# Patient Record
Sex: Female | Born: 2000 | Race: Black or African American | Hispanic: No | Marital: Single | Smoking: Never smoker
Health system: Southern US, Community
[De-identification: ages and names within clinical notes are randomized; demographics above are authoritative.]

## PROBLEM LIST (undated history)

## (undated) DIAGNOSIS — J210 Acute bronchiolitis due to respiratory syncytial virus: Secondary | ICD-10-CM

## (undated) DIAGNOSIS — J45909 Unspecified asthma, uncomplicated: Secondary | ICD-10-CM

## (undated) HISTORY — PX: CYSTECTOMY: SUR359

## (undated) HISTORY — PX: OTHER SURGICAL HISTORY: SHX169

---
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
## 2021-02-01 ENCOUNTER — Ambulatory Visit: Payer: Self-pay

## 2021-02-16 ENCOUNTER — Emergency Department
Admission: EM | Admit: 2021-02-16 | Discharge: 2021-02-16 | Disposition: A | Discharge status: Home / Self Care | Payer: Medicaid Other | Attending: Emergency Medicine | Admitting: Emergency Medicine

## 2021-02-16 ENCOUNTER — Emergency Department: Payer: Medicaid Other

## 2021-02-16 ENCOUNTER — Other Ambulatory Visit: Payer: Self-pay

## 2021-02-16 ENCOUNTER — Encounter: Payer: Self-pay | Admitting: Emergency Medicine

## 2021-02-16 DIAGNOSIS — Z7951 Long term (current) use of inhaled steroids: Secondary | ICD-10-CM | POA: Insufficient documentation

## 2021-02-16 DIAGNOSIS — M79641 Pain in right hand: Secondary | ICD-10-CM

## 2021-02-16 DIAGNOSIS — S6991XA Unspecified injury of right wrist, hand and finger(s), initial encounter: Secondary | ICD-10-CM | POA: Diagnosis present

## 2021-02-16 DIAGNOSIS — S62514A Nondisplaced fracture of proximal phalanx of right thumb, initial encounter for closed fracture: Secondary | ICD-10-CM | POA: Insufficient documentation

## 2021-02-16 DIAGNOSIS — J45909 Unspecified asthma, uncomplicated: Secondary | ICD-10-CM | POA: Diagnosis not present

## 2021-02-16 DIAGNOSIS — X58XXXA Exposure to other specified factors, initial encounter: Secondary | ICD-10-CM | POA: Insufficient documentation

## 2021-02-16 NOTE — Discharge Instructions (Signed)
You can alternate Tylenol and Ibuprofen for discomfort.  °

## 2021-02-16 NOTE — ED Provider Notes (Signed)
[facility]-EMERGENCY DEPARTMENT  ____________________________________________  Time seen: Approximately 8:40 PM  I have reviewed the triage vital signs and the nursing notes.   HISTORY  Chief Complaint Finger Injury   Historian Patient     HPI Brooke Blackwell is a 20 y.o. female presents to the emergency department with acute right thumb pain.  Patient does not recall a mechanism of injury but states that she had some alcohol last night.  She has been able to actively move her thumb since injury occurred.  No numbness or tingling of the right hand.  No similar injuries in the past.   Past Medical History:  Diagnosis Date   Asthma    RSV (acute bronchiolitis due to respiratory syncytial virus)      Immunizations up to date:  Yes.     Past Medical History:  Diagnosis Date   Asthma    RSV (acute bronchiolitis due to respiratory syncytial virus)     There are no problems to display for this patient.   Past Surgical History:  Procedure Laterality Date   CYSTECTOMY     right ear   lymphnode removal     OTHER SURGICAL HISTORY N/A    neck lymph node removed, abcess behind left ear    Prior to Admission medications   Medication Sig Start Date End Date Taking? Authorizing Provider  amoxicillin (AMOXIL) 875 MG tablet Take 1 tablet (875 mg total) by mouth 2 (two) times daily. 07/18/20   Tommi Rumps, PA-C  cetirizine (ZYRTEC) 10 MG tablet Take 10 mg by mouth daily.    [provider]  famotidine (PEPCID) 20 MG tablet Take 1 tablet (20 mg total) by mouth 2 (two) times daily. 08/20/18 08/20/19  Arnaldo Natal, MD  fluticasone (FLONASE) 50 MCG/ACT nasal spray Place 1 spray into both nostrils daily. 01/20/17 08/20/18  Orvil Feil, PA-C  Fluticasone-Salmeterol (ADVAIR) 100-50 MCG/DOSE AEPB Inhale 1 puff into the lungs 2 (two) times daily.    [provider]  montelukast (SINGULAIR) 10 MG tablet Take 10 mg by mouth at bedtime.    [provider]    Allergies Hydrocodone-acetaminophen and Ibuprofen  History reviewed. No pertinent family history.  Social History Social History   Tobacco Use   Smoking status: Never   Smokeless tobacco: Never  Substance Use Topics   Alcohol use: No   Drug use: No     Review of Systems  Constitutional: No fever/chills Eyes:  No discharge ENT: No upper respiratory complaints. Respiratory: no cough. No SOB/ use of accessory muscles to breath Gastrointestinal:   No nausea, no vomiting.  No diarrhea.  No constipation. Musculoskeletal: Patient has right thumb pain.  Skin: Negative for rash, abrasions, lacerations, ecchymosis.    ____________________________________________   PHYSICAL EXAM:  VITAL SIGNS: ED Triage Vitals  Enc Vitals Group     BP 02/16/21 1934 115/84     Pulse Rate 02/16/21 1934 75     Resp 02/16/21 1934 16     Temp 02/16/21 1934 98 F (36.7 C)     Temp Source 02/16/21 1934 Oral     SpO2 02/16/21 1934 99 %     Weight 02/16/21 1933 205 lb (93 kg)     Height 02/16/21 1933 5\' 10"  (1.778 m)     Head Circumference --      Peak Flow --      Pain Score 02/16/21 1942 6     Pain Loc --  Pain Edu? --      Excl. in GC? --      Constitutional: Alert and oriented. Well appearing and in no acute distress. Eyes: Conjunctivae are normal. PERRL. EOMI. Head: Atraumatic. ENT: Cardiovascular: Normal rate, regular rhythm. Normal S1 and S2.  Good peripheral circulation. Respiratory: Normal respiratory effort without tachypnea or retractions. Lungs CTAB. Good air entry to the bases with no decreased or absent breath sounds Gastrointestinal: Bowel sounds x 4 quadrants. Soft and nontender to palpation. No guarding or rigidity. No distention. Musculoskeletal: Patient has symmetric strength in the upper and lower extremities.  Full range of motion to all extremities. No obvious deformities noted.  Patient can perform opposition easily.  Palpable radial ulnar pulses  bilaterally and symmetrically.  Capillary refill less than 2 seconds on the right. Neurologic:  Normal for age. No gross focal neurologic deficits are appreciated.  Skin:  Skin is warm, dry and intact. No rash noted. Psychiatric: Mood and affect are normal for age. Speech and behavior are normal.   ____________________________________________   LABS (all labs ordered are listed, but only abnormal results are displayed)  Labs Reviewed - No data to display ____________________________________________  EKG   ____________________________________________  RADIOLOGY Geraldo Pitter, personally viewed and evaluated these images (plain radiographs) as part of my medical decision making, as well as reviewing the written report by the radiologist.  DG Finger Thumb Right  Result Date: 02/16/2021 CLINICAL DATA:  Swelling and pain.  No known injury. EXAM: RIGHT THUMB 2+V COMPARISON:  None. FINDINGS: Oblique fracture of the proximal phalanx right first finger without significant displacement. No articular involvement. Soft tissue swelling. No focal bone lesion. Joint spaces are normal. IMPRESSION: Nondisplaced oblique fracture involving the proximal phalanx of the right first finger. Electronically Signed   By: Burman Nieves M.D.   On: 02/16/2021 20:00    ____________________________________________    PROCEDURES  Procedure(s) performed:     Procedures     Medications - No data to display   ____________________________________________   INITIAL IMPRESSION / ASSESSMENT AND PLAN / ED COURSE  Pertinent labs & imaging results that were available during my care of the patient were reviewed by me and considered in my medical decision making (see chart for details).      Assessment and plan Right hand pain 20 year old female presents to the emergency department with acute right thumb pain.  X-ray of the right thumb indicates a nondisplaced proximal phalanx fracture.  Patient's  right thumb was splinted into extension and patient was advised to follow-up with orthopedics, Dr. Hyacinth Meeker.  Return precautions were given to return with new or worsening symptoms.     ____________________________________________  FINAL CLINICAL IMPRESSION(S) / ED DIAGNOSES  Final diagnoses:  Pain of right hand      NEW MEDICATIONS STARTED DURING THIS VISIT:  ED Discharge Orders     None           This chart was dictated using voice recognition software/Dragon. Despite best efforts to proofread, errors can occur which can change the meaning. Any change was purely unintentional.     Orvil Feil, PA-C 02/16/21 2042    Minna Antis, MD 02/25/21 1704

## 2021-02-16 NOTE — ED Triage Notes (Signed)
Pt to ED from home c/o right thumb pain, some swelling to thumb, no excessive heat to thumb, non tender to touch, pt with difficulty bending thumb, denies known injury.

## 2021-04-10 ENCOUNTER — Encounter: Payer: Self-pay | Admitting: *Deleted

## 2021-04-10 ENCOUNTER — Emergency Department
Admission: EM | Admit: 2021-04-10 | Discharge: 2021-04-10 | Disposition: A | Discharge status: Home / Self Care | Payer: Medicaid Other | Attending: Emergency Medicine | Admitting: Emergency Medicine

## 2021-04-10 ENCOUNTER — Other Ambulatory Visit: Payer: Self-pay

## 2021-04-10 DIAGNOSIS — R1013 Epigastric pain: Secondary | ICD-10-CM | POA: Diagnosis not present

## 2021-04-10 DIAGNOSIS — J45909 Unspecified asthma, uncomplicated: Secondary | ICD-10-CM | POA: Diagnosis not present

## 2021-04-10 DIAGNOSIS — Z7952 Long term (current) use of systemic steroids: Secondary | ICD-10-CM | POA: Diagnosis not present

## 2021-04-10 LAB — CBC WITH DIFFERENTIAL/PLATELET
Abs Immature Granulocytes: 0.01 10*3/uL (ref 0.00–0.07)
Basophils Absolute: 0.1 10*3/uL (ref 0.0–0.1)
Basophils Relative: 1 %
Eosinophils Absolute: 0.4 10*3/uL (ref 0.0–0.5)
Eosinophils Relative: 8 %
HCT: 36.1 % (ref 36.0–46.0)
Hemoglobin: 12 g/dL (ref 12.0–15.0)
Immature Granulocytes: 0 %
Lymphocytes Relative: 41 %
Lymphs Abs: 2 10*3/uL (ref 0.7–4.0)
MCH: 30.7 pg (ref 26.0–34.0)
MCHC: 33.2 g/dL (ref 30.0–36.0)
MCV: 92.3 fL (ref 80.0–100.0)
Monocytes Absolute: 0.4 10*3/uL (ref 0.1–1.0)
Monocytes Relative: 9 %
Neutro Abs: 1.9 10*3/uL (ref 1.7–7.7)
Neutrophils Relative %: 41 %
Platelets: 261 10*3/uL (ref 150–400)
RBC: 3.91 MIL/uL (ref 3.87–5.11)
RDW: 14.6 % (ref 11.5–15.5)
WBC: 4.7 10*3/uL (ref 4.0–10.5)
nRBC: 0 % (ref 0.0–0.2)

## 2021-04-10 LAB — LIPASE, BLOOD: Lipase: 31 U/L (ref 11–51)

## 2021-04-10 LAB — COMPREHENSIVE METABOLIC PANEL
ALT: 21 U/L (ref 0–44)
AST: 34 U/L (ref 15–41)
Albumin: 4 g/dL (ref 3.5–5.0)
Alkaline Phosphatase: 65 U/L (ref 38–126)
Anion gap: 4 — ABNORMAL LOW (ref 5–15)
BUN: 10 mg/dL (ref 6–20)
CO2: 24 mmol/L (ref 22–32)
Calcium: 9 mg/dL (ref 8.9–10.3)
Chloride: 109 mmol/L (ref 98–111)
Creatinine, Ser: 1.14 mg/dL — ABNORMAL HIGH (ref 0.44–1.00)
GFR, Estimated: >60 mL/min (ref >60)
Glucose, Bld: 86 mg/dL (ref 70–99)
Potassium: 4.3 mmol/L (ref 3.5–5.1)
Sodium: 137 mmol/L (ref 135–145)
Total Bilirubin: 0.7 mg/dL (ref 0.3–1.2)
Total Protein: 7.3 g/dL (ref 6.5–8.1)

## 2021-04-10 LAB — POC URINE PREG, ED: Preg Test, Ur: NEGATIVE

## 2021-04-10 MED ORDER — FAMOTIDINE 20 MG PO TABS: 20.0000 mg | ORAL_TABLET | Freq: Two times a day (BID) | ORAL | 0 refills | Status: AC

## 2021-04-10 MED ORDER — ALUM & MAG HYDROXIDE-SIMETH 200-200-20 MG/5ML PO SUSP
30.0000 mL | Freq: Once | ORAL | Status: AC
  Administered 2021-04-10: 30 mL via ORAL

## 2021-04-10 MED ORDER — FAMOTIDINE 20 MG PO TABS
ORAL_TABLET | ORAL | Status: AC
  Administered 2021-04-10: 20 mg via ORAL
  Filled 2021-04-10: qty 1

## 2021-04-10 MED ORDER — FAMOTIDINE 20 MG PO TABS: 20.0000 mg | ORAL_TABLET | Freq: Once | ORAL | Status: AC

## 2021-04-10 NOTE — ED Provider Notes (Signed)
  Emergency Medicine Provider Triage Evaluation Note  Brooke Blackwell , a 20 y.o.female,  was evaluated in triage.  Pt complains of abdominal pain.  Patient states that she has been experiencing cramping and a burning sensation across her abdomen for the past 24 hours.  She was reportedly experiencing pain due to incoming wisdom teeth, for which she was taking BC powder.  She states that she took approximately 4 doses of BC powder and is now feeling a burning sensation across her abdomen.  Denies chest pain, SOB, nausea/vomiting, diarrhea, or urinary symptoms.   Review of Systems  Positive: Burning sensation across abdomen. Negative: Denies fever, chest pain, vomiting  Physical Exam  There were no vitals filed for this visit. Gen:   Awake, no distress   Resp:  Normal effort  MSK:   Moves extremities without difficulty  Other:    Medical Decision Making  Given the patient's initial medical screening exam, the following diagnostic evaluation has been ordered. The patient will be placed in the appropriate treatment space, once one is available, to complete the evaluation and treatment. I have discussed the plan of care with the patient and I have advised the patient that an ED physician or mid-level practitioner will reevaluate their condition after the test results have been received, as the results may give them additional insight into the type of treatment they may need.    Diagnostics: CBC, CMP  Treatments: GI cocktail.   Varney Daily, [place] 04/10/21 1416    Delton Prairie, MD 04/10/21 [phone]

## 2021-04-10 NOTE — ED Triage Notes (Signed)
Pt to ED reporting cental pain for the past couple days that has evolved into burning abd  pain for the past day with intermittent cramping. No NVD, no changes in appetite, no fevers.

## 2021-04-10 NOTE — Discharge Instructions (Signed)
Avoid taking any NSAIDs as this may aggravate your stomach inflammation.  You can start taking the Pepcid twice daily for pain.  Please return to the emergency department if you develop worsening pain, fevers or unable to eat or drink.

## 2021-04-11 NOTE — ED Provider Notes (Signed)
[facility]  ____________________________________________   Event Date/Time   First MD Initiated Contact with Patient 04/10/21 2104     (approximate)  I have reviewed the triage vital signs and the nursing notes.   HISTORY  Chief Complaint Abdominal Pain and Dental Pain    HPI Brooke Blackwell is a 20 y.o. female with past medical history of asthma who presents with abdominal pain.  Symptoms have been going on for about 2 days.  She endorses a burning-like sensation in the epigastric region that is intermittent.  Is worse after eating.  She has not had any associated nausea vomiting diarrhea or constipation.  Denies urinary symptoms.  No fevers.  Of note patient has had her wisdom teeth coming and has been taking multiple BCs for the pain and thinks this may be related.  Has not tried anything over-the-counter for the pain.  No history of similar.  No dark stools.         Past Medical History:  Diagnosis Date   Asthma    RSV (acute bronchiolitis due to respiratory syncytial virus)     There are no problems to display for this patient.   Past Surgical History:  Procedure Laterality Date   CYSTECTOMY     right ear   lymphnode removal     OTHER SURGICAL HISTORY N/A    neck lymph node removed, abcess behind left ear    Prior to Admission medications   Medication Sig Start Date End Date Taking? Authorizing Provider  famotidine (PEPCID) 20 MG tablet Take 1 tablet (20 mg total) by mouth 2 (two) times daily. 04/10/21 05/10/21 Yes Georga Hacking, MD  amoxicillin (AMOXIL) 875 MG tablet Take 1 tablet (875 mg total) by mouth 2 (two) times daily. 07/18/20   Tommi Rumps, PA-C  cetirizine (ZYRTEC) 10 MG tablet Take 10 mg by mouth daily.    [provider]  famotidine (PEPCID) 20 MG tablet Take 1 tablet (20 mg total) by mouth 2 (two) times daily. 08/20/18 08/20/19  Arnaldo Natal, MD  fluticasone (FLONASE) 50 MCG/ACT nasal spray Place 1  spray into both nostrils daily. 01/20/17 08/20/18  Orvil Feil, PA-C  Fluticasone-Salmeterol (ADVAIR) 100-50 MCG/DOSE AEPB Inhale 1 puff into the lungs 2 (two) times daily.    [provider]  montelukast (SINGULAIR) 10 MG tablet Take 10 mg by mouth at bedtime.    [provider]    Allergies Hydrocodone-acetaminophen and Ibuprofen  History reviewed. No pertinent family history.  Social History Social History   Tobacco Use   Smoking status: Never   Smokeless tobacco: Never  Substance Use Topics   Alcohol use: No   Drug use: No    Review of Systems   Review of Systems  Constitutional:  Negative for appetite change, chills and fever.  Gastrointestinal:  Positive for abdominal pain. Negative for constipation, diarrhea, nausea and vomiting.  Genitourinary:  Negative for dysuria.  All other systems reviewed and are negative.  Physical Exam Updated Vital Signs BP 118/68   Pulse 64   Temp 98.4 F (36.9 C) (Oral)   Resp 16   Ht 5\' 10"  (1.778 m)   Wt 93 kg   SpO2 99%   BMI 29.42 kg/m   Physical Exam Vitals and nursing note reviewed.  Constitutional:      General: She is not in acute distress.    Appearance: Normal appearance.  HENT:     Head: Normocephalic and atraumatic.  Comments: No focal tenderness to percussion of the teeth, no swelling or evidence of abscess Eyes:     General: No scleral icterus.    Conjunctiva/sclera: Conjunctivae normal.  Pulmonary:     Effort: Pulmonary effort is normal. No respiratory distress.     Breath sounds: No stridor.  Abdominal:     General: Abdomen is flat.     Palpations: Abdomen is soft.     Comments: Mild tenderness palpation epigastric region, no lower quadrant tenderness, no guarding  Musculoskeletal:        General: No deformity or signs of injury.     Cervical back: Normal range of motion.  Skin:    General: Skin is dry.     Coloration: Skin is not jaundiced or pale.  Neurological:     General:  No focal deficit present.     Mental Status: She is alert and oriented to person, place, and time. Mental status is at baseline.  Psychiatric:        Mood and Affect: Mood normal.        Behavior: Behavior normal.     LABS (all labs ordered are listed, but only abnormal results are displayed)  Labs Reviewed  COMPREHENSIVE METABOLIC PANEL - Abnormal; Notable for the following components:      Result Value   Creatinine, Ser 1.14 (*)    Anion gap 4 (*)    All other components within normal limits  POC URINE PREG, ED - Normal  CBC WITH DIFFERENTIAL/PLATELET  LIPASE, BLOOD   ____________________________________________  EKG  N/a ____________________________________________  RADIOLOGY Ky Barban, personally viewed and evaluated these images (plain radiographs) as part of my medical decision making, as well as reviewing the written report by the radiologist.  ED MD interpretation:  n/a    ____________________________________________   PROCEDURES  Procedure(s) performed (including Critical Care):  Procedures   ____________________________________________   INITIAL IMPRESSION / ASSESSMENT AND PLAN / ED COURSE     Patient is a 20 year old female presents with epigastric burning worse after eating time several days.  Vital signs within normal limits.  She is very well-appearing.  Her abdomen is benign she does have tenderness in the epigastric region.  Labs are reassuring, normal lipase and LFTs.  hCG is negative.  Patient has been taking significant amount of BCs for tooth pain over the last several days which I suspect may be related to her pain which is likely gastritis.  She is able to tolerate p.o. in the ED.  Received a GI cocktail and Pepcid.  Advised to stop taking NSAIDs and discharged with prescription for Pepcid.  Return precautions discussed.      ____________________________________________   FINAL CLINICAL IMPRESSION(S) / ED DIAGNOSES  Final  diagnoses:  Epigastric pain     ED Discharge Orders          Ordered    famotidine (PEPCID) 20 MG tablet  2 times daily        04/10/21 2139             Note:  This document was prepared using Dragon voice recognition software and may include unintentional dictation errors.    Georga Hacking, MD 04/11/21 [phone]

## 2021-08-23 ENCOUNTER — Encounter: Payer: Self-pay | Admitting: Emergency Medicine

## 2021-08-23 ENCOUNTER — Emergency Department
Admission: EM | Admit: 2021-08-23 | Discharge: 2021-08-23 | Disposition: A | Discharge status: Home / Self Care | Payer: Medicaid Other | Attending: Emergency Medicine | Admitting: Emergency Medicine

## 2021-08-23 ENCOUNTER — Other Ambulatory Visit: Payer: Self-pay

## 2021-08-23 DIAGNOSIS — N390 Urinary tract infection, site not specified: Secondary | ICD-10-CM | POA: Insufficient documentation

## 2021-08-23 DIAGNOSIS — R350 Frequency of micturition: Secondary | ICD-10-CM | POA: Diagnosis present

## 2021-08-23 LAB — URINALYSIS, ROUTINE W REFLEX MICROSCOPIC
Bacteria, UA: NONE SEEN
Bilirubin Urine: NEGATIVE
Glucose, UA: NEGATIVE mg/dL
Ketones, ur: NEGATIVE mg/dL
Nitrite: NEGATIVE
Protein, ur: 100 mg/dL — AB
RBC / HPF: >50 RBC/hpf — ABNORMAL HIGH (ref 0–5)
Specific Gravity, Urine: 1.024 (ref 1.005–1.030)
WBC, UA: >50 WBC/hpf — ABNORMAL HIGH (ref 0–5)
pH: 5 (ref 5.0–8.0)

## 2021-08-23 LAB — PREGNANCY, URINE: Preg Test, Ur: NEGATIVE

## 2021-08-23 MED ORDER — CEPHALEXIN 500 MG PO CAPS
500.0000 mg | ORAL_CAPSULE | Freq: Two times a day (BID) | ORAL | 0 refills | Status: AC
Start: 2021-08-23 — End: 2021-08-30

## 2021-08-23 MED ORDER — CEPHALEXIN 500 MG PO CAPS
500.0000 mg | ORAL_CAPSULE | Freq: Once | ORAL | Status: AC
  Administered 2021-08-23: 500 mg via ORAL
  Filled 2021-08-23: qty 1

## 2021-08-23 NOTE — ED Notes (Signed)
Pt c/o urinary frequency that started today. Pt denies any burning sensation.  ?

## 2021-08-23 NOTE — ED Provider Notes (Signed)
? ?[facility] ?Provider Note ? ? ? Event Date/Time  ? First MD Initiated Contact with Patient 08/23/21 1515   ?  (approximate) ? ? ?History  ? ?Chief Complaint ?Urinary Frequency ? ?HPI ? ?Brooke Blackwell is a 21 y.o. female with no significant past medical history who presents to the ED complaining of urinary frequency.  Patient reports that for the past 24 hours she has noticed that she is urinating much more frequently than usual, additionally reports a pressure-like sensation in her lower abdomen that is alleviated when she goes to urinate.  She has not had any dysuria or hematuria, denies any fevers, abdominal pain, or flank pain.  She has not had any vaginal bleeding or discharge, states her LMP was about a week and a half ago. ?  ? ? ?Physical Exam  ? ?Triage Vital Signs: ?ED Triage Vitals  ?Enc Vitals Group  ?   BP 08/23/21 1427 125/78  ?   Pulse Rate 08/23/21 1428 77  ?   Resp 08/23/21 1427 18  ?   Temp 08/23/21 1427 98.1 ?F (36.7 ?C)  ?   Temp src --   ?   SpO2 08/23/21 1427 97 %  ?   Weight 08/23/21 1424 205 lb 0.4 oz (93 kg)  ?   Height 08/23/21 1424 5\' 9"  (1.753 m)  ?   Head Circumference --   ?   Peak Flow --   ?   Pain Score 08/23/21 1424 0  ?   Pain Loc --   ?   Pain Edu? --   ?   Excl. in GC? --   ? ? ?Most recent vital signs: ?Vitals:  ? 08/23/21 1427 08/23/21 1428  ?BP: 125/78   ?Pulse:  77  ?Resp: 18   ?Temp: 98.1 ?F (36.7 ?C)   ?SpO2: 97%   ? ? ?Constitutional: Alert and oriented. ?Eyes: Conjunctivae are normal. ?Head: Atraumatic. ?Nose: No congestion/rhinnorhea. ?Mouth/Throat: Mucous membranes are moist.  ?Cardiovascular: Normal rate, regular rhythm. Grossly normal heart sounds.  2+ radial pulses bilaterally. ?Respiratory: Normal respiratory effort.  No retractions. Lungs CTAB. ?Gastrointestinal: Soft and nontender. No distention. ?Musculoskeletal: No lower extremity tenderness nor edema.  ?Neurologic:  Normal speech and language. No gross focal neurologic  deficits are appreciated. ? ? ? ?ED Results / Procedures / Treatments  ? ?Labs ?(all labs ordered are listed, but only abnormal results are displayed) ?Labs Reviewed  ?URINALYSIS, ROUTINE W REFLEX MICROSCOPIC - Abnormal; Notable for the following components:  ?    Result Value  ? Color, Urine YELLOW (*)   ? APPearance CLOUDY (*)   ? Hgb urine dipstick LARGE (*)   ? Protein, ur 100 (*)   ? Leukocytes,Ua LARGE (*)   ? RBC / HPF >50 (*)   ? WBC, UA >50 (*)   ? All other components within normal limits  ?URINE CULTURE  ?PREGNANCY, URINE  ?POC URINE PREG, ED  ? ? ?PROCEDURES: ? ?Critical Care performed: No ? ?Procedures ? ? ?MEDICATIONS ORDERED IN ED: ?Medications  ?cephALEXin (KEFLEX) capsule 500 mg (has no administration in time range)  ? ? ? ?IMPRESSION / MDM / ASSESSMENT AND PLAN / ED COURSE  ?I reviewed the triage vital signs and the nursing notes. ?             ?               ? ?21 y.o. female with no significant past medical history who  presents to the ED with 24 hours of urinary frequency along with pressure in her lower abdomen alleviated when she goes to urinate. ? ?Differential diagnosis includes, but is not limited to, cystitis, glucosuria, proteinuria, pregnancy. ? ?Patient nontoxic-appearing and in no acute distress, vital signs are unremarkable and she has a benign abdominal exam.  Symptoms sound consistent with cystitis and UA is pending, we will also check pregnancy test. ? ?Pregnancy testing is negative, UA appears consistent with UTI and we will send for culture.  Patient given initial dose of Keflex here in the ED and is appropriate for discharge home with treatment for acute cystitis.  She was counseled to follow-up with her pediatrician and to otherwise return to the ED for new or worsening symptoms, patient agrees with plan. ? ?  ? ? ?FINAL CLINICAL IMPRESSION(S) / ED DIAGNOSES  ? ?Final diagnoses:  ?Lower urinary tract infectious disease  ? ? ? ?Rx / DC Orders  ? ?ED Discharge Orders   ? ?       Ordered  ?  cephALEXin (KEFLEX) 500 MG capsule  2 times daily       ? 08/23/21 1818  ? ?  ?  ? ?  ? ? ? ?Note:  This document was prepared using Dragon voice recognition software and may include unintentional dictation errors. ?  ?Chesley Noon, MD ?08/23/21 1819 ? ?

## 2021-08-23 NOTE — ED Notes (Signed)
Pt verbalized understanding of discharge instructions, prescriptions, and follow-up care instructions. Pt advised if symptoms worsen to return to ED. E-signature not available due to e-signature pad not working.  

## 2021-08-23 NOTE — ED Triage Notes (Signed)
Pt comes into the ED via POV c/o possible UTI.  Pt states she is having a urinary frequency and need to go to the bathroom often.  Pt denies any burning with urination.  Pt in NAD at this time.  ?

## 2021-08-24 LAB — POC URINE PREG, ED: Preg Test, Ur: NEGATIVE

## 2021-08-26 LAB — URINE CULTURE: Culture: 80000 — AB

## 2021-11-05 ENCOUNTER — Emergency Department
Admission: EM | Admit: 2021-11-05 | Discharge: 2021-11-05 | Disposition: A | Discharge status: Home / Self Care | Payer: Medicaid Other | Attending: Emergency Medicine | Admitting: Emergency Medicine

## 2021-11-05 ENCOUNTER — Encounter: Payer: Self-pay | Admitting: Emergency Medicine

## 2021-11-05 ENCOUNTER — Other Ambulatory Visit: Payer: Self-pay

## 2021-11-05 ENCOUNTER — Emergency Department: Payer: Medicaid Other

## 2021-11-05 DIAGNOSIS — R079 Chest pain, unspecified: Secondary | ICD-10-CM | POA: Insufficient documentation

## 2021-11-05 DIAGNOSIS — R0789 Other chest pain: Secondary | ICD-10-CM

## 2021-11-05 DIAGNOSIS — U070 Vaping-related disorder: Secondary | ICD-10-CM | POA: Insufficient documentation

## 2021-11-05 DIAGNOSIS — Z7289 Other problems related to lifestyle: Secondary | ICD-10-CM

## 2021-11-05 LAB — CBC
HCT: 39 % (ref 36.0–46.0)
Hemoglobin: 12.7 g/dL (ref 12.0–15.0)
MCH: 29.8 pg (ref 26.0–34.0)
MCHC: 32.6 g/dL (ref 30.0–36.0)
MCV: 91.5 fL (ref 80.0–100.0)
Platelets: 231 10*3/uL (ref 150–400)
RBC: 4.26 MIL/uL (ref 3.87–5.11)
RDW: 13.9 % (ref 11.5–15.5)
WBC: 4 10*3/uL (ref 4.0–10.5)
nRBC: 0 % (ref 0.0–0.2)

## 2021-11-05 LAB — BASIC METABOLIC PANEL
Anion gap: 4 — ABNORMAL LOW (ref 5–15)
BUN: 11 mg/dL (ref 6–20)
CO2: 23 mmol/L (ref 22–32)
Calcium: 8.9 mg/dL (ref 8.9–10.3)
Chloride: 109 mmol/L (ref 98–111)
Creatinine, Ser: 0.89 mg/dL (ref 0.44–1.00)
GFR, Estimated: >60 mL/min (ref >60)
Glucose, Bld: 96 mg/dL (ref 70–99)
Potassium: 4.1 mmol/L (ref 3.5–5.1)
Sodium: 136 mmol/L (ref 135–145)

## 2021-11-05 LAB — TROPONIN I (HIGH SENSITIVITY): Troponin I (High Sensitivity): <2 ng/L (ref <18)

## 2021-11-05 NOTE — ED Triage Notes (Signed)
Pt reports left sided chest pain that has been off and on x 1 month. Pt denies pain at the moment. A/O x 4 NAD noted.

## 2021-11-05 NOTE — ED Provider Notes (Signed)
[facility] Provider Note    Event Date/Time   First MD Initiated Contact with Patient 11/05/21 1247     (approximate)   History   Chest Pain   HPI  Brooke Blackwell is a 21 y.o. female who presents to the ED for evaluation of Chest Pain   Patient presents to the ED with her mom for evaluation of 1-2 months of intermittent chest pain.  She reports pain that is almost every day, lasting minutes and self resolving.  No coexisting symptoms such as dyspnea, dizziness, emesis, fever, cough or falling.  She reports that she does vape every day.  Her mother is quite concerned it is due to this and is asking me to tell her to stop.  Physical Exam   Triage Vital Signs: ED Triage Vitals  Enc Vitals Group     BP 11/05/21 1052 128/83     Pulse Rate 11/05/21 1052 79     Resp 11/05/21 1052 16     Temp 11/05/21 1052 98.8 F (37.1 C)     Temp Source 11/05/21 1052 Oral     SpO2 11/05/21 1052 99 %     Weight --      Height --      Head Circumference --      Peak Flow --      Pain Score 11/05/21 1056 0     Pain Loc --      Pain Edu? --      Excl. in GC? --     Most recent vital signs: Vitals:   11/05/21 1052  BP: 128/83  Pulse: 79  Resp: 16  Temp: 98.8 F (37.1 C)  SpO2: 99%    General: Awake, no distress.  CV:  Good peripheral perfusion. RRR Resp:  Normal effort. CTAB Abd:  No distention.  MSK:  No deformity noted.  Neuro:  No focal deficits appreciated. Other:     ED Results / Procedures / Treatments   Labs (all labs ordered are listed, but only abnormal results are displayed) Labs Reviewed  BASIC METABOLIC PANEL - Abnormal; Notable for the following components:      Result Value   Anion gap 4 (*)    All other components within normal limits  CBC  POC URINE PREG, ED  TROPONIN I (HIGH SENSITIVITY)  TROPONIN I (HIGH SENSITIVITY)    EKG Sinus rhythm, rate of 84 bpm.  Rightward axis and normal intervals.  No evidence of acute  ischemia.  RADIOLOGY CXR interpreted by me without evidence of acute cardiopulmonary pathology.  Official radiology report(s): DG Chest 2 View  Result Date: 11/05/2021 CLINICAL DATA:  Chest pain. EXAM: CHEST - 2 VIEW COMPARISON:  August 20, 2018. FINDINGS: The heart size and mediastinal contours are within normal limits. Both lungs are clear. No visible pleural effusions or pneumothorax. No acute osseous abnormality. IMPRESSION: No active cardiopulmonary disease. Electronically Signed   By: Feliberto Harts M.D.   On: 11/05/2021 11:33    PROCEDURES and INTERVENTIONS:  .1-3 Lead EKG Interpretation  Performed by: Delton Prairie, MD Authorized by: Delton Prairie, MD     Interpretation: normal     ECG rate:  78   ECG rate assessment: normal     Rhythm: sinus rhythm     Ectopy: none     Conduction: normal     Medications - No data to display   IMPRESSION / MDM / ASSESSMENT AND PLAN / ED COURSE  I reviewed the  triage vital signs and the nursing notes.  Differential diagnosis includes, but is not limited to, ACS, PTX, PNA, muscle strain/spasm, PE, dissection  {Patient presents with symptoms of an acute illness or injury that is potentially life-threatening.  Healthy 21 year old girl presents to the ED with a month of atypical chest pain without evidence of acute pathology and suitable for outpatient management.  Normal vitals and examination.  She looks well.  Normal CBC, BMP and negative troponin.  X-ray is clear and EKG is nonischemic.  She has no pain here and I doubt cardiopulmonary pathology.  We discussed return precautions.      FINAL CLINICAL IMPRESSION(S) / ED DIAGNOSES   Final diagnoses:  Other chest pain  Current every day vaping     Rx / DC Orders   ED Discharge Orders     None        Note:  This document was prepared using Dragon voice recognition software and may include unintentional dictation errors.   Delton Prairie, MD 11/05/21 1328

## 2022-06-20 IMAGING — CR DG FINGER THUMB 2+V*R*
3 series · 3 of 3 positions shown · non-contrast
Comparison: None.

CLINICAL DATA: Swelling and pain.  No known injury.

EXAM:
RIGHT THUMB 2+V

[finger ap]
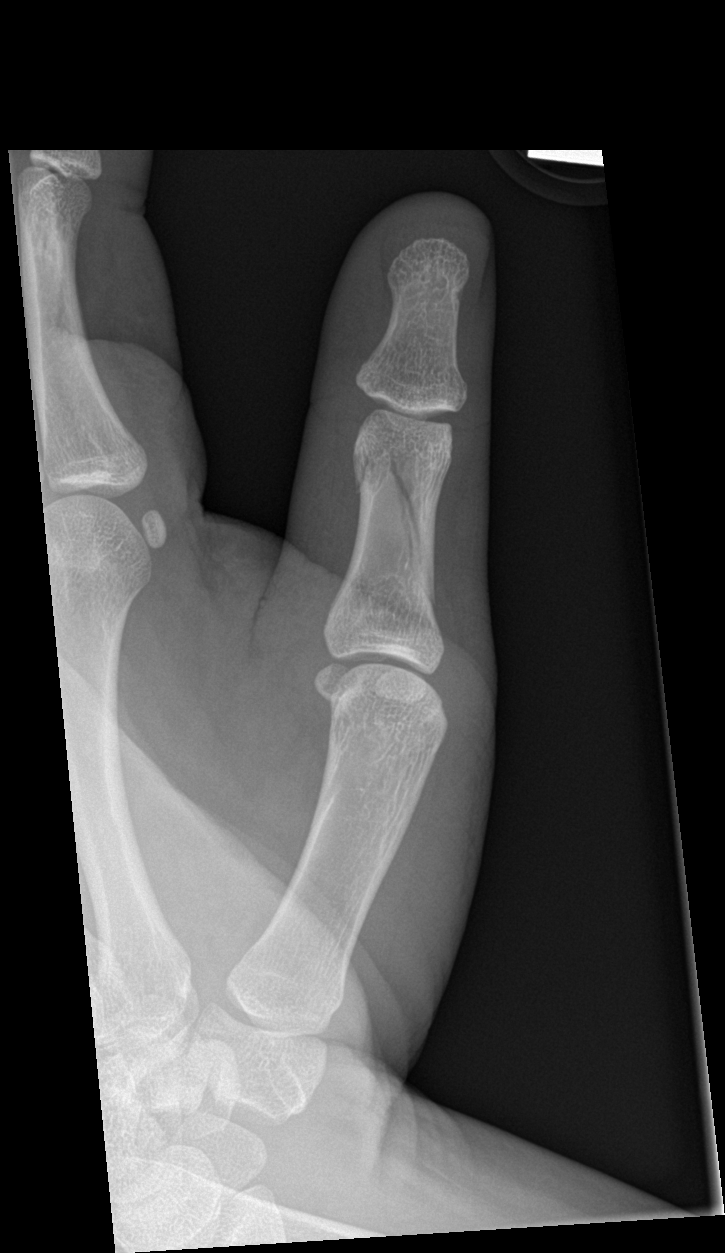

[finger obl]
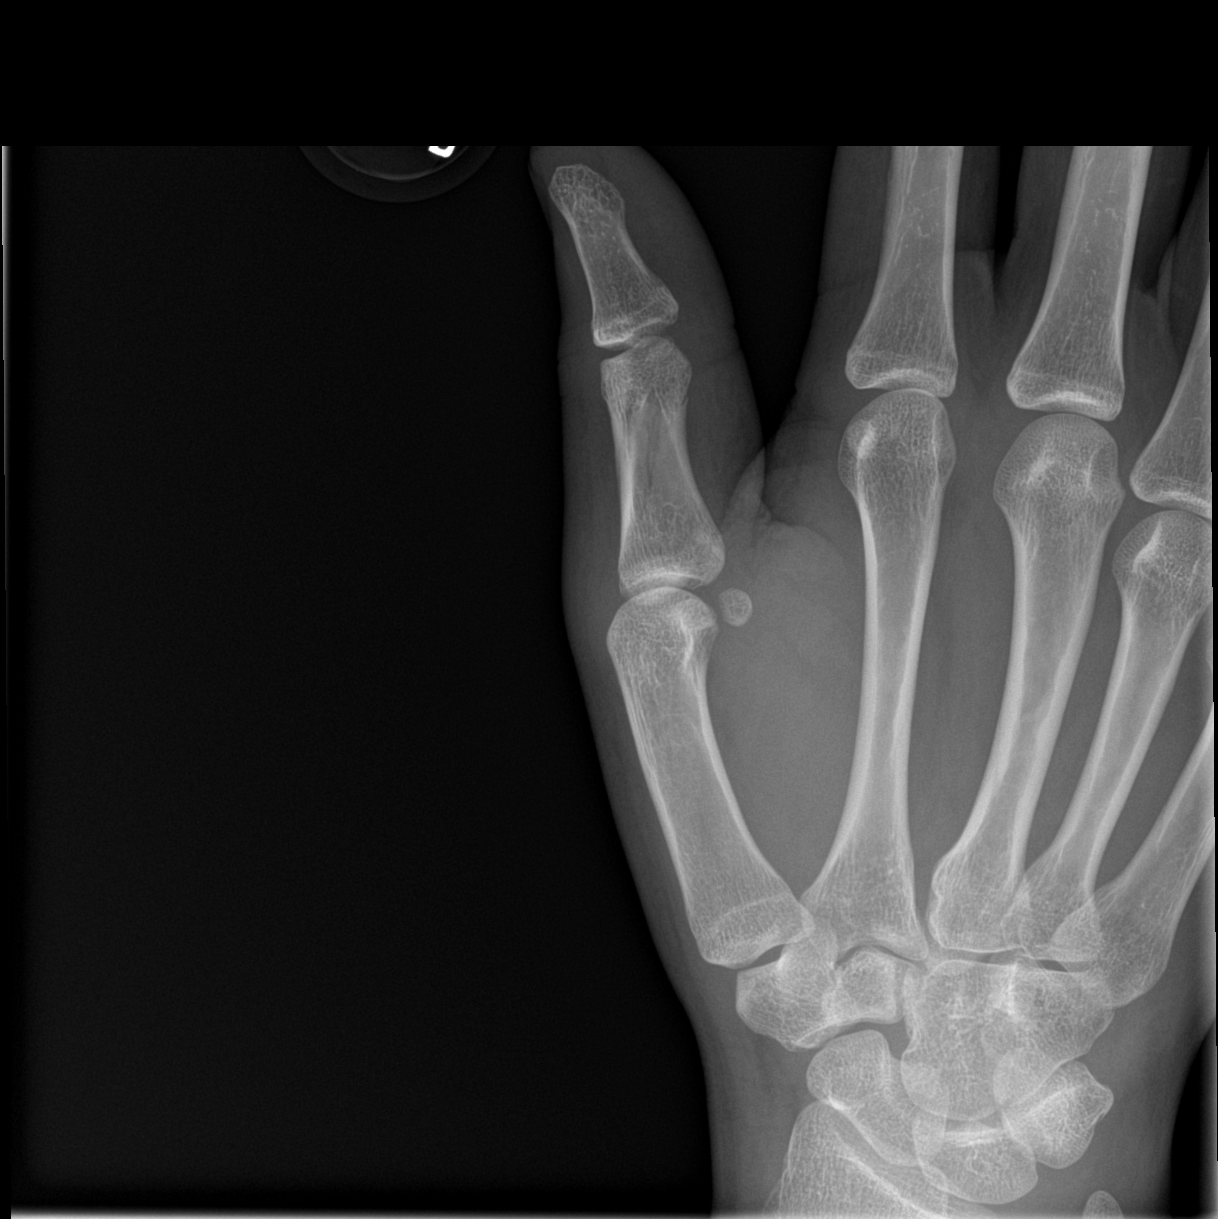

[finger lat]
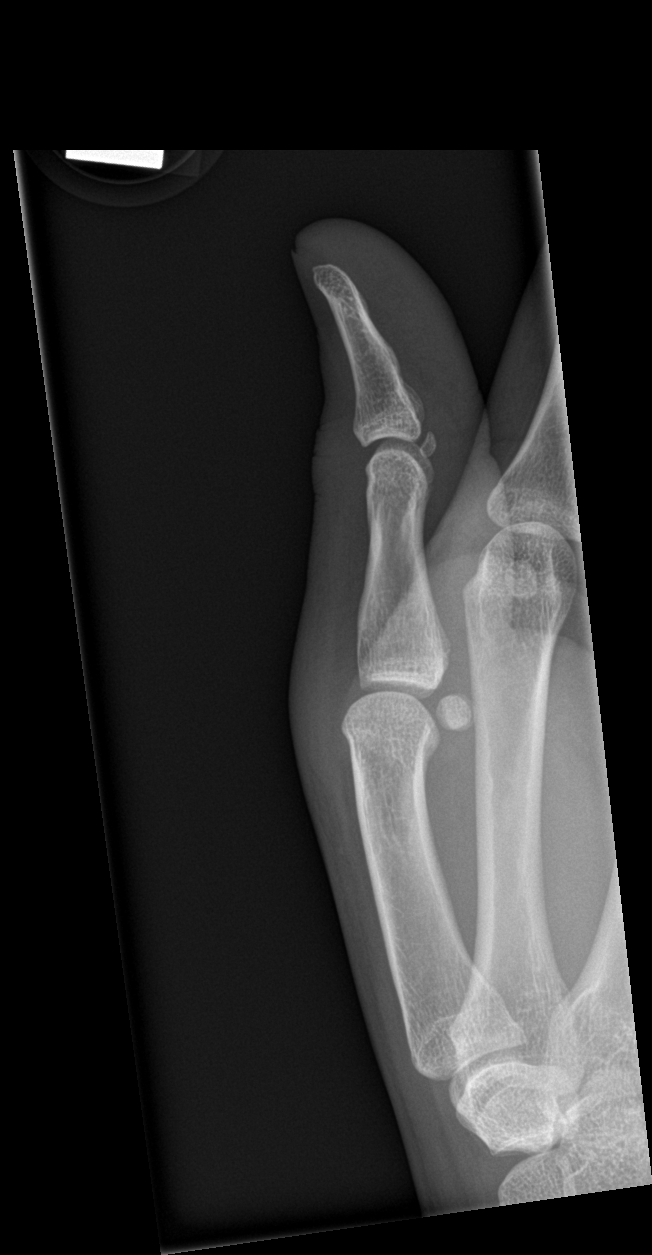

[3 of 3 positions shown; findings below may reference images not displayed]

FINDINGS: Oblique fracture of the proximal phalanx right first finger without
significant displacement. No articular involvement. Soft tissue
swelling. No focal bone lesion. Joint spaces are normal.
IMPRESSION: Nondisplaced oblique fracture involving the proximal phalanx of the
right first finger.

## 2023-03-14 ENCOUNTER — Telehealth: Payer: Self-pay | Admitting: Family Medicine

## 2023-03-14 NOTE — Telephone Encounter (Signed)
Pt spoke with Kathi Der and appointment made for 11/5 @ 1:20 pm.  Berdie Ogren, RN

## 2023-03-14 NOTE — Telephone Encounter (Signed)
 LM for pt to return my call.  Berdie Ogren, RN

## 2023-03-14 NOTE — Telephone Encounter (Signed)
Please give me a call back My partner tested positive for an sti and I need to be seen asap then the appt you have available

## 2023-03-18 ENCOUNTER — Ambulatory Visit: Payer: Self-pay

## 2023-04-27 ENCOUNTER — Emergency Department: Payer: Medicaid Other

## 2023-04-27 ENCOUNTER — Other Ambulatory Visit: Payer: Self-pay

## 2023-04-27 ENCOUNTER — Emergency Department
Admission: EM | Admit: 2023-04-27 | Discharge: 2023-04-27 | Disposition: A | Discharge status: Home / Self Care | Payer: Medicaid Other | Attending: Emergency Medicine | Admitting: Emergency Medicine

## 2023-04-27 DIAGNOSIS — N939 Abnormal uterine and vaginal bleeding, unspecified: Secondary | ICD-10-CM | POA: Diagnosis present

## 2023-04-27 LAB — CBC
HCT: 40.5 % (ref 36.0–46.0)
Hemoglobin: 13.6 g/dL (ref 12.0–15.0)
MCH: 30.6 pg (ref 26.0–34.0)
MCHC: 33.6 g/dL (ref 30.0–36.0)
MCV: 91.2 fL (ref 80.0–100.0)
Platelets: 242 10*3/uL (ref 150–400)
RBC: 4.44 MIL/uL (ref 3.87–5.11)
RDW: 13.8 % (ref 11.5–15.5)
WBC: 4.7 10*3/uL (ref 4.0–10.5)
nRBC: 0 % (ref 0.0–0.2)

## 2023-04-27 LAB — BASIC METABOLIC PANEL
Anion gap: 7 (ref 5–15)
BUN: 12 mg/dL (ref 6–20)
CO2: 22 mmol/L (ref 22–32)
Calcium: 8.9 mg/dL (ref 8.9–10.3)
Chloride: 106 mmol/L (ref 98–111)
Creatinine, Ser: 0.94 mg/dL (ref 0.44–1.00)
GFR, Estimated: >60 mL/min (ref >60)
Glucose, Bld: 105 mg/dL — ABNORMAL HIGH (ref 70–99)
Potassium: 3.8 mmol/L (ref 3.5–5.1)
Sodium: 135 mmol/L (ref 135–145)

## 2023-04-27 LAB — HCG, QUANTITATIVE, PREGNANCY: hCG, Beta Chain, Quant, S: <1 m[IU]/mL (ref <5)

## 2023-04-27 LAB — POC URINE PREG, ED: Preg Test, Ur: NEGATIVE

## 2023-04-27 NOTE — Discharge Instructions (Addendum)
Your workup was reassuring and you can follow-up outpatient with OB/GYN to discuss your vaginal bleeding and return to the ER for bleeding more than a pad an hour, weakness, dizziness or any other concerns   IMPRESSION:  Normal pelvic ultrasound.

## 2023-04-27 NOTE — ED Triage Notes (Addendum)
C/O vaginal bleeding x 2 weeks.  Denies dysuria   AAOx3.  Skin warm and dry. NAD

## 2023-04-27 NOTE — ED Provider Notes (Signed)
[facility] Provider Note    Event Date/Time   First MD Initiated Contact with Patient 04/27/23 1501     (approximate)   History   Vaginal Bleeding   HPI  Brooke Blackwell is a 22 y.o. female who comes in with concerns for abnormal vaginal bleeding.  Patient reports that she had her Nexplanon taken out 1 year ago.  She reports that since then she has had very consistent bleeding but this time it has been lasting for a prolonged period of time.  She reports having to use a few pads during the day as well as tampons but denies it being more than a pad an hour.  She does report some left lower quadrant pain associated with it.  She reports last being sexually active in November.  She reports recent STD testing while with this partner that was negative and declines STD testing today.  She denies any vaginal discharge.   Physical Exam   Triage Vital Signs: ED Triage Vitals [04/27/23 1430]  Encounter Vitals Group     BP (!) 136/95     Systolic BP Percentile      Diastolic BP Percentile      Pulse Rate 66     Resp 16     Temp 98.4 F (36.9 C)     Temp Source Oral     SpO2 98 %     Weight 205 lb 0.4 oz (93 kg)     Height      Head Circumference      Peak Flow      Pain Score 2     Pain Loc      Pain Education      Exclude from Growth Chart     Most recent vital signs: Vitals:   04/27/23 1430  BP: (!) 136/95  Pulse: 66  Resp: 16  Temp: 98.4 F (36.9 C)  SpO2: 98%     General: Awake, no distress.  CV:  Good peripheral perfusion.  Resp:  Normal effort.  Abd:  No distention.  Soft nontender Other:     ED Results / Procedures / Treatments   Labs (all labs ordered are listed, but only abnormal results are displayed) Labs Reviewed  BASIC METABOLIC PANEL - Abnormal; Notable for the following components:      Result Value   Glucose, Bld 105 (*)    All other components within normal limits  CBC  POC URINE PREG, ED      RADIOLOGY I have reviewed the ultrasound personally and interpreted and no large cyst.   PROCEDURES:  Critical Care performed: No  Procedures   MEDICATIONS ORDERED IN ED: Medications - No data to display   IMPRESSION / MDM / ASSESSMENT AND PLAN / ED COURSE  I reviewed the triage vital signs and the nursing notes.   Patient's presentation is most consistent with acute presentation with potential threat to life or bodily function.   Patient comes in with left lower quadrant tenderness as well as some intermittent vaginal spotting.  We discussed that these are usually not emergent and can be followed up with OB/GYN given the left lower quadrant pain that we can do ultrasound to make sure there is no signs of ovarian torsion or cyst rupture.  She expressed understanding felt comfortable with proceeding.  She declined STD testing  Preg test negative Cbc normal hemoglobin Bmp normal  HCG negative [place] negative  Discussed with patient for with OB/GYN to  discuss birth control if this is continued to be an issue with irregular periods however at this time she is going to hold off on birth control given she reports not feeling well with the Nexplanon.      FINAL CLINICAL IMPRESSION(S) / ED DIAGNOSES   Final diagnoses:  Abnormal uterine bleeding (AUB)     Rx / DC Orders   ED Discharge Orders     None        Note:  This document was prepared using Dragon voice recognition software and may include unintentional dictation errors.   Concha Se, MD 04/27/23 [phone]

## 2023-07-11 ENCOUNTER — Encounter: Payer: Self-pay | Admitting: Emergency Medicine

## 2023-07-11 ENCOUNTER — Other Ambulatory Visit: Payer: Self-pay

## 2023-07-11 ENCOUNTER — Emergency Department
Admission: EM | Admit: 2023-07-11 | Discharge: 2023-07-11 | Disposition: A | Discharge status: Home / Self Care | Payer: Medicaid Other | Attending: Emergency Medicine | Admitting: Emergency Medicine

## 2023-07-11 DIAGNOSIS — J029 Acute pharyngitis, unspecified: Secondary | ICD-10-CM | POA: Diagnosis present

## 2023-07-11 DIAGNOSIS — J45909 Unspecified asthma, uncomplicated: Secondary | ICD-10-CM | POA: Insufficient documentation

## 2023-07-11 LAB — GROUP A STREP BY PCR: Group A Strep by PCR: NOT DETECTED

## 2023-07-11 MED ORDER — AMOXICILLIN 500 MG PO TABS: 500.0000 mg | ORAL_TABLET | Freq: Three times a day (TID) | ORAL | 0 refills | Status: AC

## 2023-07-11 NOTE — ED Triage Notes (Signed)
 Pt to ED via POV c/o sore throat x 1 day. Pt states that she did have chills but no fever yesterday. Pt denies cough, congestion, N/V. Pt denies sick contacts. Pt does not have body aches.

## 2023-07-11 NOTE — Discharge Instructions (Signed)
 As we discussed, if you aren't feeling better in 48 hours, you develop a fever, or your throat feels worse, start the antibiotic.  Return to the ER or see primary care for symptoms of concern.

## 2023-07-11 NOTE — ED Provider Notes (Signed)
   [facility] Provider Note    Event Date/Time   First MD Initiated Contact with Patient 07/11/23 [phone]     (approximate)   History   Sore Throat   HPI  Brooke Blackwell is a 23 y.o. female  with history of asthma and as listed in EMR presents to the emergency department for treatment and evaluation of sore throat x 1 day. Reports chills without fever. No cough, congestion, nausea, vomiting, or diarrhea.      Physical Exam   Triage Vital Signs: ED Triage Vitals  Encounter Vitals Group     BP 07/11/23 0923 112/71     Systolic BP Percentile --      Diastolic BP Percentile --      Pulse Rate 07/11/23 0923 (!) 104     Resp 07/11/23 0923 16     Temp 07/11/23 0923 98.9 F (37.2 C)     Temp src --      SpO2 07/11/23 0923 96 %     Weight 07/11/23 0924 220 lb (99.8 kg)     Height 07/11/23 0924 5\' 9"  (1.753 m)     Head Circumference --      Peak Flow --      Pain Score 07/11/23 0924 6     Pain Loc --      Pain Education --      Exclude from Growth Chart --     Most recent vital signs: Vitals:   07/11/23 0923  BP: 112/71  Pulse: (!) 104  Resp: 16  Temp: 98.9 F (37.2 C)  SpO2: 96%    General: Awake, no distress.  CV:  Good peripheral perfusion.  Resp:  Normal effort.  Abd:  No distention.  Other:  Tonsils 2+ bilaterally without exudate.   ED Results / Procedures / Treatments   Labs (all labs ordered are listed, but only abnormal results are displayed) Labs Reviewed  GROUP A STREP BY PCR     EKG  Not indicated.   RADIOLOGY  Image and radiology report reviewed and interpreted by me. Radiology report consistent with the same.  Not indicated.  PROCEDURES:  Critical Care performed: No  Procedures   MEDICATIONS ORDERED IN ED:  Medications - No data to display   IMPRESSION / MDM / ASSESSMENT AND PLAN / ED COURSE   I have reviewed the triage note.  Differential diagnosis includes, but is not limited to,  covid, influenza, strep throat  Patient's presentation is most consistent with acute complicated illness / injury requiring diagnostic workup.  23 year old female presents to the ER due to sore throat. See HPI.  Strep screen is negative. On exam I see no exudates.  Plan will be symptomatic treatment and have her start antibiotic if not improving in 48 hours. She is agreeable to the plan.      FINAL CLINICAL IMPRESSION(S) / ED DIAGNOSES   Final diagnoses:  Acute pharyngitis, unspecified etiology     Rx / DC Orders   ED Discharge Orders          Ordered    amoxicillin (AMOXIL) 500 MG tablet  3 times daily        07/11/23 1022             Note:  This document was prepared using Dragon voice recognition software and may include unintentional dictation errors.   Chinita Pester, FNP 07/12/23 1914    Chesley Noon, MD 07/12/23 [phone]

## 2024-01-25 ENCOUNTER — Other Ambulatory Visit: Payer: Self-pay

## 2024-01-25 ENCOUNTER — Emergency Department
Admission: EM | Admit: 2024-01-25 | Discharge: 2024-01-25 | Discharge status: Against Medical Advice | Payer: Self-pay | Attending: Emergency Medicine | Admitting: Emergency Medicine

## 2024-01-25 ENCOUNTER — Emergency Department: Payer: Self-pay

## 2024-01-25 DIAGNOSIS — R1030 Lower abdominal pain, unspecified: Secondary | ICD-10-CM | POA: Insufficient documentation

## 2024-01-25 DIAGNOSIS — Z5321 Procedure and treatment not carried out due to patient leaving prior to being seen by health care provider: Secondary | ICD-10-CM | POA: Insufficient documentation

## 2024-01-25 LAB — CBC
HCT: 33.7 % — ABNORMAL LOW (ref 36.0–46.0)
Hemoglobin: 11.8 g/dL — ABNORMAL LOW (ref 12.0–15.0)
MCH: 31 pg (ref 26.0–34.0)
MCHC: 35 g/dL (ref 30.0–36.0)
MCV: 88.5 fL (ref 80.0–100.0)
Platelets: 223 K/uL (ref 150–400)
RBC: 3.81 MIL/uL — ABNORMAL LOW (ref 3.87–5.11)
RDW: 13.4 % (ref 11.5–15.5)
WBC: 6.3 K/uL (ref 4.0–10.5)
nRBC: 0 % (ref 0.0–0.2)

## 2024-01-25 LAB — COMPREHENSIVE METABOLIC PANEL WITH GFR
ALT: 22 U/L (ref 0–44)
AST: 25 U/L (ref 15–41)
Albumin: 3.8 g/dL (ref 3.5–5.0)
Alkaline Phosphatase: 47 U/L (ref 38–126)
Anion gap: 8 (ref 5–15)
BUN: 8 mg/dL (ref 6–20)
CO2: 20 mmol/L — ABNORMAL LOW (ref 22–32)
Calcium: 9.1 mg/dL (ref 8.9–10.3)
Chloride: 108 mmol/L (ref 98–111)
Creatinine, Ser: 0.76 mg/dL (ref 0.44–1.00)
GFR, Estimated: >60 mL/min (ref >60)
Glucose, Bld: 89 mg/dL (ref 70–99)
Potassium: 3.6 mmol/L (ref 3.5–5.1)
Sodium: 136 mmol/L (ref 135–145)
Total Bilirubin: 0.6 mg/dL (ref 0.0–1.2)
Total Protein: 7.1 g/dL (ref 6.5–8.1)

## 2024-01-25 LAB — URINALYSIS, ROUTINE W REFLEX MICROSCOPIC
Bilirubin Urine: NEGATIVE
Glucose, UA: NEGATIVE mg/dL
Hgb urine dipstick: NEGATIVE
Ketones, ur: NEGATIVE mg/dL
Nitrite: NEGATIVE
Protein, ur: NEGATIVE mg/dL
Specific Gravity, Urine: 1.023 (ref 1.005–1.030)
pH: 5 (ref 5.0–8.0)

## 2024-01-25 LAB — HCG, QUANTITATIVE, PREGNANCY: hCG, Beta Chain, Quant, S: 142883 m[IU]/mL — ABNORMAL HIGH (ref <5)

## 2024-01-25 LAB — POC URINE PREG, ED: Preg Test, Ur: POSITIVE — AB

## 2024-01-25 LAB — LIPASE, BLOOD: Lipase: 28 U/L (ref 11–51)

## 2024-01-25 NOTE — ED Triage Notes (Signed)
 Pt arrives with c/o lower ABD pain that started yesterday. Per pt, she had 3 weeks of vaginal bleeding and then ABD pain started. Pt denies vaginal bleeding at this time.

## 2024-01-25 NOTE — ED Notes (Signed)
 No answer x1

## 2024-01-27 ENCOUNTER — Telehealth: Payer: Self-pay | Admitting: Emergency Medicine

## 2024-01-27 NOTE — Telephone Encounter (Signed)
 Called patient due to left emergency department before provider exam to inquire about condition and follow up plans. Left message.
